# Patient Record
Sex: Male | Born: 1970 | Race: Black or African American | Hispanic: No | Marital: Single | Smoking: Current every day smoker
Health system: Southern US, Community
[De-identification: ages and names within clinical notes are randomized; demographics above are authoritative.]

---

## 2008-10-12 ENCOUNTER — Emergency Department (HOSPITAL_BASED_OUTPATIENT_CLINIC_OR_DEPARTMENT_OTHER): Admission: EM | Admit: 2008-10-12 | Discharge: 2008-10-12 | Payer: Self-pay | Admitting: Emergency Medicine

## 2008-10-12 ENCOUNTER — Ambulatory Visit: Payer: Self-pay | Admitting: Diagnostic Radiology

## 2009-01-04 ENCOUNTER — Ambulatory Visit: Payer: Self-pay | Admitting: Diagnostic Radiology

## 2009-01-04 ENCOUNTER — Emergency Department (HOSPITAL_BASED_OUTPATIENT_CLINIC_OR_DEPARTMENT_OTHER): Admission: EM | Admit: 2009-01-04 | Discharge: 2009-01-04 | Payer: Self-pay | Admitting: Emergency Medicine

## 2010-07-25 LAB — CBC
HCT: 47.7 % (ref 39.0–52.0)
Hemoglobin: 15.9 g/dL (ref 13.0–17.0)
MCHC: 33.3 g/dL (ref 30.0–36.0)
MCV: 84.4 fL (ref 78.0–100.0)
RDW: 13.2 % (ref 11.5–15.5)

## 2010-07-25 LAB — DIFFERENTIAL
Basophils Absolute: 0.4 10*3/uL — ABNORMAL HIGH (ref 0.0–0.1)
Basophils Relative: 3 % — ABNORMAL HIGH (ref 0–1)
Eosinophils Absolute: 0.4 10*3/uL (ref 0.0–0.7)
Eosinophils Relative: 3 % (ref 0–5)
Monocytes Absolute: 0.9 10*3/uL (ref 0.1–1.0)
Neutro Abs: 8 10*3/uL — ABNORMAL HIGH (ref 1.7–7.7)

## 2011-04-04 IMAGING — CR DG SHOULDER 2+V*L*
3 series · 3 of 3 positions shown · non-contrast
Comparison: 10/12/2008

CLINICAL DATA: Left shoulder pain

LEFT SHOULDER - 2+ VIEW

[w shoulder ap internal left]
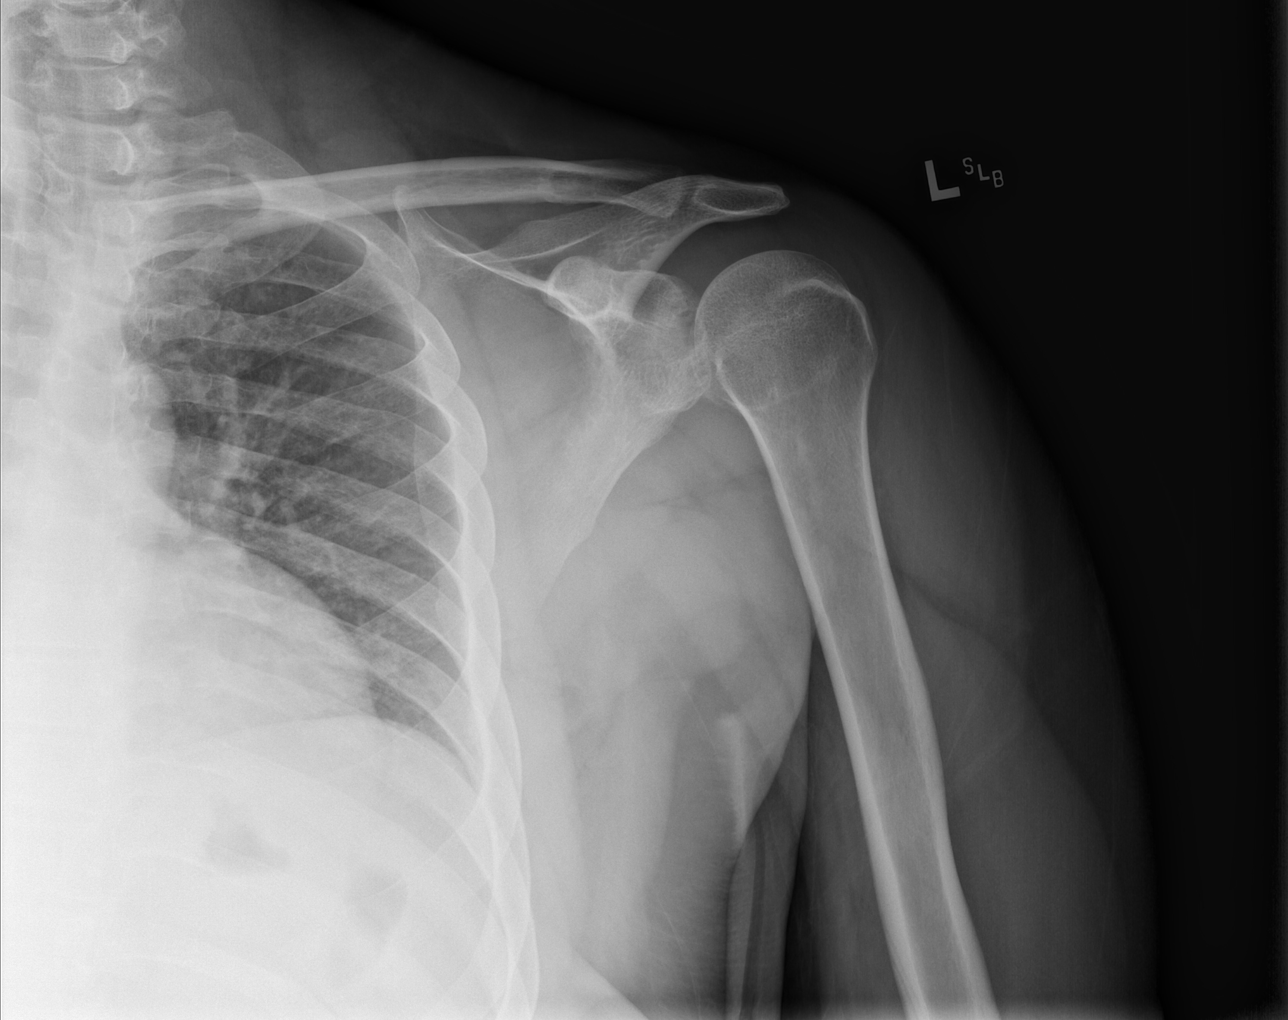

[w shoulder ap external left]
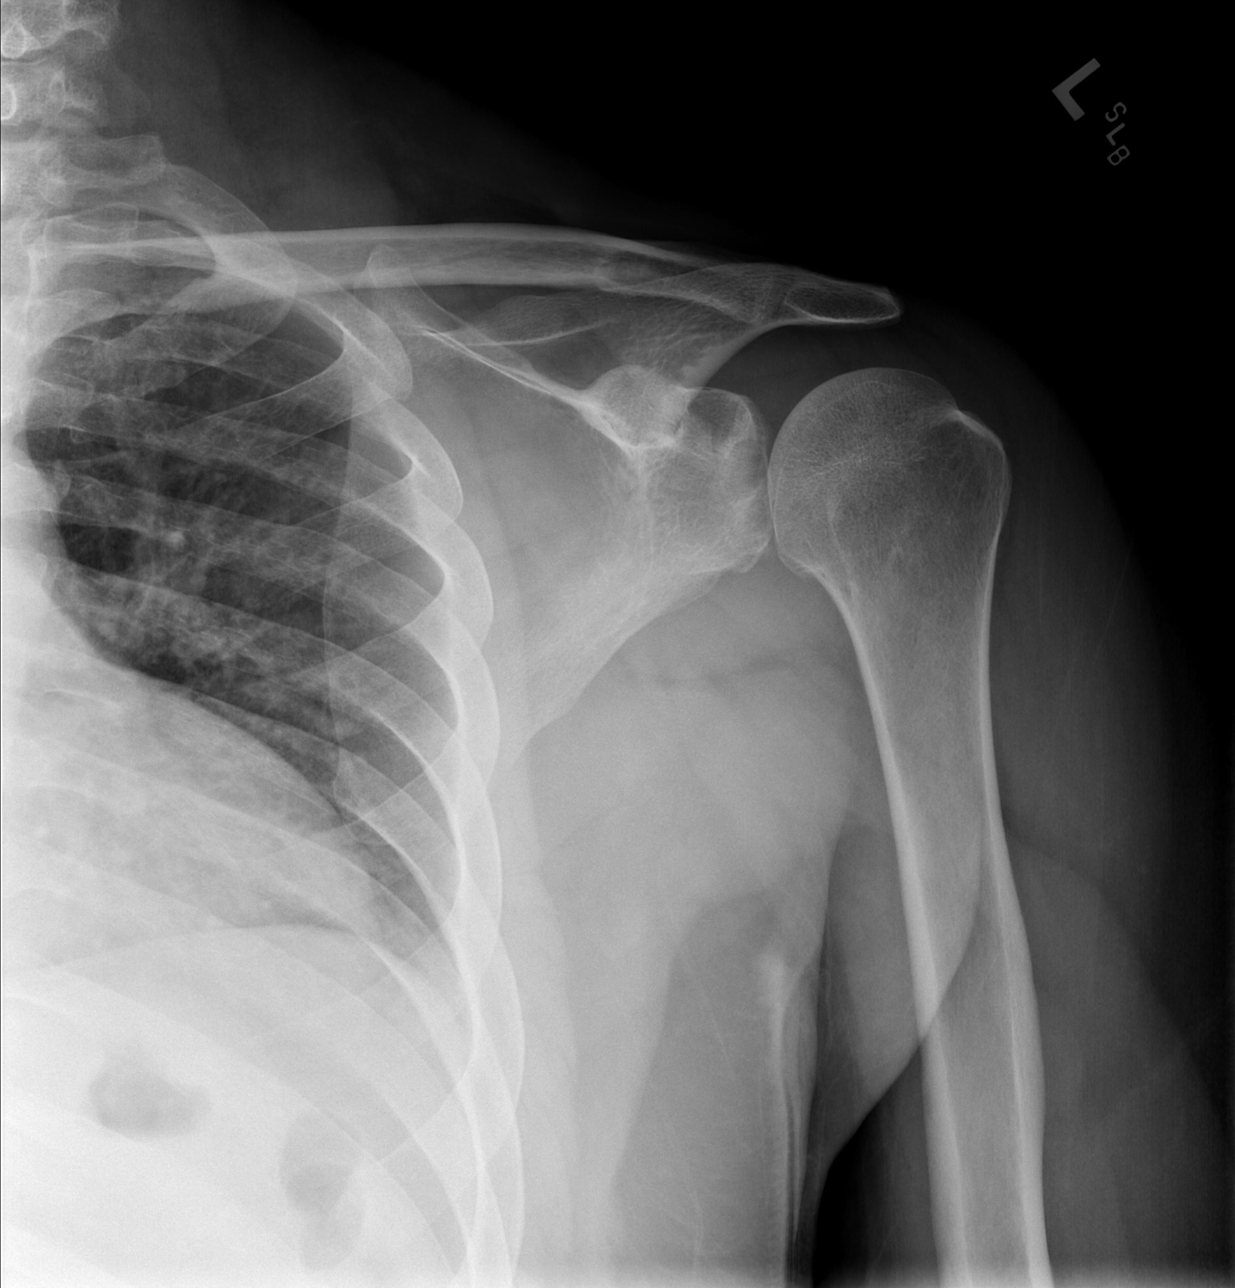

[w shoulder y view left]
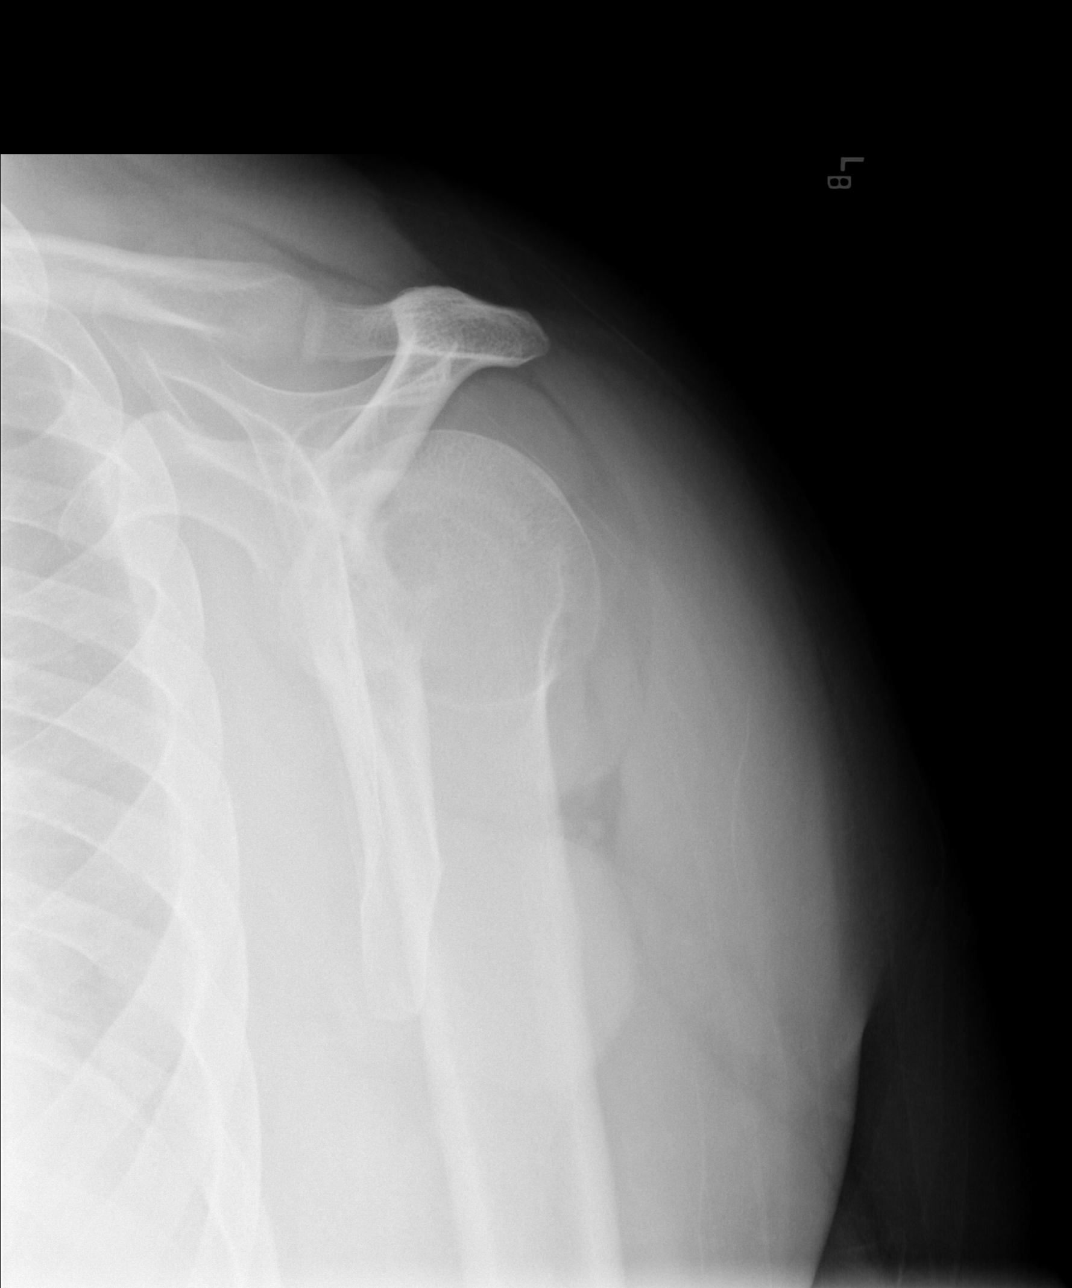

[3 of 3 positions shown; findings below may reference images not displayed]

FINDINGS: Three views of the left shoulder submitted.  No acute
fracture or subluxation.  Stable mild degenerative changes
IMPRESSION: No acute fracture or subluxation.  Stable mild degenerative
changes.

## 2018-12-09 ENCOUNTER — Encounter (HOSPITAL_BASED_OUTPATIENT_CLINIC_OR_DEPARTMENT_OTHER): Payer: Self-pay | Admitting: *Deleted

## 2018-12-09 ENCOUNTER — Other Ambulatory Visit: Payer: Self-pay

## 2018-12-09 ENCOUNTER — Emergency Department (HOSPITAL_BASED_OUTPATIENT_CLINIC_OR_DEPARTMENT_OTHER)
Admission: EM | Admit: 2018-12-09 | Discharge: 2018-12-09 | Disposition: A | Discharge status: Home / Self Care | Payer: Self-pay | Attending: Emergency Medicine | Admitting: Emergency Medicine

## 2018-12-09 DIAGNOSIS — K0889 Other specified disorders of teeth and supporting structures: Secondary | ICD-10-CM | POA: Insufficient documentation

## 2018-12-09 DIAGNOSIS — F17200 Nicotine dependence, unspecified, uncomplicated: Secondary | ICD-10-CM | POA: Insufficient documentation

## 2018-12-09 MED ORDER — AMOXICILLIN-POT CLAVULANATE 875-125 MG PO TABS: 1.0000 | ORAL_TABLET | Freq: Two times a day (BID) | ORAL | 0 refills | Status: AC

## 2018-12-09 MED ORDER — HYDROCODONE-ACETAMINOPHEN 5-325 MG PO TABS
2.0000 | ORAL_TABLET | Freq: Once | ORAL | Status: AC
  Administered 2018-12-09: 2 via ORAL
  Filled 2018-12-09: qty 2

## 2018-12-09 MED ORDER — HYDROCODONE-ACETAMINOPHEN 5-325 MG PO TABS: 1.0000 | ORAL_TABLET | ORAL | 0 refills | Status: AC | PRN

## 2018-12-09 NOTE — ED Notes (Signed)
Patient is resting comfortably. 

## 2018-12-09 NOTE — ED Triage Notes (Signed)
Dental pain on and off x 2 days.

## 2018-12-09 NOTE — ED Provider Notes (Signed)
[facility] EMERGENCY DEPARTMENT Provider Note   CSN: [id number] Arrival date & time: 12/09/18  1401     History   Chief Complaint Chief Complaint  Patient presents with  . Dental Pain    HPI Brett Keith is a 48 y.o. male.     HPI 48 year old male presents with severe right dental pain.  He states both maxillary and mandibular pain.  Started 2 days ago.  He has had pain in this area before ever since he had 2 teeth removed 2 or 3 years ago.  This is the worst is ever been.  No injuries.  No swelling or fever.  Tried some of his wife's Percocet with no relief.  History reviewed. No pertinent past medical history.  There are no active problems to display for this patient.   History reviewed. No pertinent surgical history.      Home Medications    Prior to Admission medications   Medication Sig Start Date End Date Taking? Authorizing Provider  amoxicillin-clavulanate (AUGMENTIN) 875-125 MG tablet Take 1 tablet by mouth 2 (two) times daily. One po bid x 7 days 12/09/18   Sherwood Gambler, MD  HYDROcodone-acetaminophen [facility]) 5-325 MG tablet Take 1 tablet by mouth every 4 (four) hours as needed for severe pain. 12/09/18   Sherwood Gambler, MD    Family History No family history on file.  Social History Social History   Tobacco Use  . Smoking status: Current Every Day Smoker  . Smokeless tobacco: Never Used  Substance Use Topics  . Alcohol use: Not on file  . Drug use: Not on file     Allergies   Patient has no known allergies.   Review of Systems Review of Systems  Constitutional: Negative for fever.  HENT: Positive for dental problem. Negative for facial swelling.   Respiratory: Negative for shortness of breath.      Physical Exam Updated Vital Signs BP (!) 142/110 (BP Location: Left Arm)   Pulse 61   Temp 99.1 F (37.3 C) (Oral)   Resp 16   Ht 5\' 11"  (1.803 m)   Wt 131.5 kg   SpO2 100%   BMI 40.45 kg/m   Physical Exam Vitals  signs and nursing note reviewed.  Constitutional:      Appearance: He is well-developed.  HENT:     Head: Normocephalic and atraumatic.     Comments: Diffuse pain to the right mandibular and maxillary gum lines. No overt abscess. No facial swelling    Right Ear: External ear normal.     Left Ear: External ear normal.     Nose: Nose normal.  Eyes:     General:        Right eye: No discharge.        Left eye: No discharge.  Neck:     Musculoskeletal: Neck supple.  Cardiovascular:     Rate and Rhythm: Normal rate and regular rhythm.     Heart sounds: Normal heart sounds.  Pulmonary:     Effort: Pulmonary effort is normal.     Breath sounds: Normal breath sounds.  Abdominal:     General: There is no distension.  Skin:    General: Skin is warm and dry.  Neurological:     Mental Status: He is alert.  Psychiatric:        Mood and Affect: Mood is not anxious.      ED Treatments / Results  Labs (all labs ordered are listed, but only abnormal  results are displayed) Labs Reviewed - No data to display  EKG None  Radiology No results found.  Procedures Procedures (including critical care time)  Medications Ordered in ED Medications  HYDROcodone-acetaminophen (NORCO/VICODIN) 5-325 MG per tablet 2 tablet (2 tablets Oral Given 12/09/18 1432)     Initial Impression / Assessment and Plan / ED Course  I have reviewed the triage vital signs and the nursing notes.  Pertinent labs & imaging results that were available during my care of the patient were reviewed by me and considered in my medical decision making (see chart for details).        Patient appears in significant pain though I cannot find an obvious drainable abscess.  Likely the causes dental, perhaps pulpitis.  He has naproxen at home and I will give him a short course of Norco for pain as well as covering with Augmentin.  Refer to dentistry/oral surgery. Discussed return precautions.  Final Clinical  Impressions(s) / ED Diagnoses   Final diagnoses:  Pain, dental    ED Discharge Orders         Ordered    HYDROcodone-acetaminophen (NORCO) 5-325 MG tablet  Every 4 hours PRN     12/09/18 1437    amoxicillin-clavulanate (AUGMENTIN) 875-125 MG tablet  2 times daily     12/09/18 1437           Pricilla LovelessGoldston, Bre Pecina, MD 12/09/18 1445

## 2018-12-09 NOTE — Discharge Instructions (Signed)
If you develop fever, trouble breathing or swallowing, vomiting, or uncontrolled pain, or any other new/concerning symptoms then return to the ER for evaluation.

## 2018-12-09 NOTE — ED Notes (Signed)
Pt teaching provided on medications that may cause drowsiness. Pt instructed not to drive or operate heavy machinery while taking the prescribed medication. Pt verbalized understanding.   

## 2020-11-17 ENCOUNTER — Encounter (HOSPITAL_BASED_OUTPATIENT_CLINIC_OR_DEPARTMENT_OTHER): Payer: Self-pay

## 2020-11-17 ENCOUNTER — Other Ambulatory Visit: Payer: Self-pay

## 2020-11-17 ENCOUNTER — Emergency Department (HOSPITAL_BASED_OUTPATIENT_CLINIC_OR_DEPARTMENT_OTHER)
Admission: EM | Admit: 2020-11-17 | Discharge: 2020-11-17 | Disposition: A | Discharge status: Home / Self Care | Payer: Self-pay | Attending: Emergency Medicine | Admitting: Emergency Medicine

## 2020-11-17 DIAGNOSIS — F17210 Nicotine dependence, cigarettes, uncomplicated: Secondary | ICD-10-CM | POA: Insufficient documentation

## 2020-11-17 DIAGNOSIS — L299 Pruritus, unspecified: Secondary | ICD-10-CM | POA: Insufficient documentation

## 2020-11-17 DIAGNOSIS — R21 Rash and other nonspecific skin eruption: Secondary | ICD-10-CM

## 2020-11-17 MED ORDER — PREDNISONE 50 MG PO TABS: 50.0000 mg | ORAL_TABLET | Freq: Every day | ORAL | 0 refills | Status: AC

## 2020-11-17 MED ORDER — HYDROXYZINE HCL 25 MG PO TABS: 25.0000 mg | ORAL_TABLET | Freq: Four times a day (QID) | ORAL | 0 refills | Status: AC

## 2020-11-17 MED ORDER — HYDROCORTISONE 2.5 % EX LOTN: TOPICAL_LOTION | Freq: Two times a day (BID) | CUTANEOUS | 0 refills | Status: AC

## 2020-11-17 NOTE — ED Provider Notes (Signed)
[facility] EMERGENCY DEPARTMENT Provider Note   CSN: [id number] Arrival date & time: 11/17/20  1537     History Chief Complaint  Patient presents with   Poison Ivy    Brett Keith is a 50 y.o. male with no significant past medical history who presents for evaluation of rash.  Rash to entire body x1 week.  States this began after washing his dog who is out in the yard.  No rash to palms, soles, mucous membranes.  Rash is pruritic in nature.  Nontender.  No vesicles or bulla.  Did state he did yard work last week.  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  No new lotions, perfumes or detergents.  No lesions to mouth, sensation of throat closing, cough, shortness of breath abdominal pain or emesis.  Has never had anything like this previously.  Is been using calamine lotion without relief.  Denies additional aggravating or alleviating factors. No myalgias, tick bites.  History obtained from patient and past medical records.  No interpreter used.  HPI     History reviewed. No pertinent past medical history.  There are no problems to display for this patient.   History reviewed. No pertinent surgical history.     No family history on file.  Social History   Tobacco Use   Smoking status: Every Day    Types: Cigarettes   Smokeless tobacco: Never  Vaping Use   Vaping Use: Never used  Substance Use Topics   Alcohol use: Yes    Comment: occ   Drug use: Not Currently    Home Medications Prior to Admission medications   Medication Sig Start Date End Date Taking? Authorizing Provider  hydrocortisone 2.5 % lotion Apply topically 2 (two) times daily. Do not apply to face 11/17/20  Yes Hannahgrace Lalli A, PA-C  hydrOXYzine (ATARAX/VISTARIL) 25 MG tablet Take 1 tablet (25 mg total) by mouth every 6 (six) hours. 11/17/20  Yes Leafy Motsinger A, PA-C  predniSONE (DELTASONE) 50 MG tablet Take 1 tablet (50 mg total) by mouth daily for 5 days. 11/17/20  11/22/20 Yes Adrianah Prophete A, PA-C  amoxicillin-clavulanate (AUGMENTIN) 875-125 MG tablet Take 1 tablet by mouth 2 (two) times daily. One po bid x 7 days 12/09/18   Pricilla Loveless, MD  HYDROcodone-acetaminophen [facility]) 5-325 MG tablet Take 1 tablet by mouth every 4 (four) hours as needed for severe pain. 12/09/18   Pricilla Loveless, MD    Allergies    Patient has no known allergies.  Review of Systems   Review of Systems  Constitutional: Negative.   HENT: Negative.    Respiratory: Negative.    Cardiovascular: Negative.   Gastrointestinal: Negative.   Genitourinary: Negative.   Musculoskeletal: Negative.   Skin:  Positive for rash.  Neurological: Negative.   All other systems reviewed and are negative.  Physical Exam Updated Vital Signs BP 123/79 (BP Location: Right Arm)   Pulse 99   Temp 98.9 F (37.2 C) (Oral)   Resp 16   Ht 5\' 11"  (1.803 m)   Wt 136.1 kg   SpO2 96%   BMI 41.84 kg/m   Physical Exam Vitals and nursing note reviewed.  Constitutional:      General: He is not in acute distress.    Appearance: He is well-developed. He is not ill-appearing, toxic-appearing or diaphoretic.  HENT:     Head: Normocephalic and atraumatic.     Nose: Nose normal.     Mouth/Throat:  Mouth: Mucous membranes are moist.     Comments: No intraoral lesions.  No angioedema Eyes:     Pupils: Pupils are equal, round, and reactive to light.  Cardiovascular:     Rate and Rhythm: Normal rate and regular rhythm.     Pulses: Normal pulses.     Heart sounds: Normal heart sounds.  Pulmonary:     Effort: Pulmonary effort is normal. No respiratory distress.     Breath sounds: Normal breath sounds.  Abdominal:     General: Bowel sounds are normal. There is no distension.     Palpations: Abdomen is soft.  Musculoskeletal:        General: Normal range of motion.     Cervical back: Normal range of motion and neck supple.     Comments: No bony tenderness.  Moves all 4 extremities without  difficulty  Skin:    General: Skin is warm and dry.     Capillary Refill: Capillary refill takes less than 2 seconds.     Findings: Rash present.     Comments: Erythematous rash to anterior, posterior chest as well as bilateral upper extremities.  No rash to mucous membranes.  No vesicles, bulla, target lesions, desquamated skin.  No pustules.  No lesions concerning for monkey box.  Neurological:     General: No focal deficit present.     Mental Status: He is alert and oriented to person, place, and time.     ED Results / Procedures / Treatments   Labs (all labs ordered are listed, but only abnormal results are displayed) Labs Reviewed - No data to display  EKG None  Radiology No results found.  Procedures Procedures   Medications Ordered in ED Medications - No data to display  ED Course  I have reviewed the triage vital signs and the nursing notes.  Pertinent labs & imaging results that were available during my care of the patient were reviewed by me and considered in my medical decision making (see chart for details).  Here for evaluation of rash x1 week.  He is afebrile, nonseptic, not ill-appearing.  No systemic symptoms.  Began after washing dog with been on the yard.  Rash is pruritic in nature.  No rash to mucous membranes.  No rash to palms or soles.  Nonconcerning for monkey box.  No sensation of throat closing, new lotions, perfumes or concerning signs for allergic reaction.  No viral symptoms.  Patient denies any difficulty breathing or swallowing.  Pt has a patent airway without stridor and is handling secretions without difficulty; no angioedema. No blisters, no pustules, no warmth, no draining sinus tracts, no superficial abscesses, no bullous impetigo, no vesicles, no desquamation, no target lesions with dusky purpura or a central bulla. Not tender to touch. No concern for superimposed infection. No concern for SJS, TEN, TSS, tick borne illness, syphilis or other  life-threatening condition. Will discharge home with short course of steroids, pepcid and recommend Benadryl as needed for pruritis.     MDM Rules/Calculators/A&P                            Final Clinical Impression(s) / ED Diagnoses Final diagnoses:  Rash    Rx / DC Orders ED Discharge Orders          Ordered    predniSONE (DELTASONE) 50 MG tablet  Daily        11/17/20 1951    hydrocortisone 2.5 %  lotion  2 times daily        11/17/20 1951    hydrOXYzine (ATARAX/VISTARIL) 25 MG tablet  Every 6 hours        11/17/20 1951             Troyce Gieske A, PA-C 11/17/20 1952    Milagros Loll, MD 11/20/20 2157

## 2020-11-17 NOTE — ED Triage Notes (Signed)
Pt c/o poison ivy/scattered rash x 1 week-NAD-steady gait

## 2020-11-17 NOTE — Discharge Instructions (Addendum)
May use hydrocortisone cream on rash.  Do not apply to face Take prednisone as prescribed May take Atarax as needed for itching  Follow-up with primary care provider or dermatology if her symptoms do not improve.  Return for new or worsening symptoms

## 2021-10-03 NOTE — ED Provider Notes (Signed)
 Emergency Department Provider Note  Dragon voice dictation used for charting.    Provider at bedside: 7:11 AM  History obtained from the: Patient  History   Chief Complaint  Patient presents with   Hand Pain     HPI  Brett Keith is a 51 y.o. male who presents to the ED with complaints of left hand pain.  The patient states that he was involved in an accident at work approximately 1 year ago.  He has been working with [employer]. to get this injury evaluated and notes that he has been through physical therapy and has seen an orthopedic specialist, however he is currently waiting for referrals to other specialists at this time.  Today, he is complaining of pain to the dorsum of the left hand and increased swelling.  He denies any recent injuries to the area and he has not had any fevers.    No LMP for male patient.   Past Medical History History reviewed. No pertinent past medical history.  Past Surgical History History reviewed. No pertinent surgical history.   Medications Home Medications  Medication Sig Dispense Refill   azithromycin (ZITHROMAX) 250 MG tablet Take two tablets on the first day and then one tablet every day after. 6 tablet 0   benzonatate (TESSALON PERLES) 200 MG capsule Take 1 capsule (200 mg total) by mouth 3 (three)  times daily as needed for Cough. 20 capsule 0   diclofenac (VOLTAREN) 1 % Gel gel Apply 4g the the affected area up to four times daily, max dose 16g/day 200 g 0     Allergies No Known Allergies   Family History History reviewed. No pertinent family history.   Social History Social History   Tobacco Use   Smoking status: Every Day    Packs/day: 0.50    Types: Cigarettes  Substance Use Topics   Alcohol use: No   Drug use: No     Review of Systems  Review of Systems  Constitutional: Negative for chills and fever.  Musculoskeletal:       L hand pain and swelling  Allergic/Immunologic: Negative for  immunocompromised state.  Hematological: Does not bruise/bleed easily.     Physical Exam   Vitals:   10/03/21 0630 10/03/21 0700 10/03/21 0739 10/03/21 0742  BP: 137/95 141/89 124/99 129/99  Pulse: 84 78 78   Temp: 98.5 F (36.9 C)     Resp: 16 18  20   SpO2: 98% 97% 97% 96%  PainSc: 8-Eight (severe)       Physical Exam Vitals and nursing note reviewed.  Constitutional:      Appearance: Normal appearance.  HENT:     Head: Normocephalic and atraumatic.     Nose: Nose normal.  Eyes:     Conjunctiva/sclera: Conjunctivae normal.  Cardiovascular:     Rate and Rhythm: Normal rate and regular rhythm.     Pulses: Normal pulses.     Heart sounds: Normal heart sounds.  Pulmonary:     Effort: Pulmonary effort is normal.     Breath sounds: Normal breath sounds.  Musculoskeletal:     Comments: Mild swelling to the dorsum of the right hand near the third/fourth MCP.  Patient is tender to palpation in this area.  He is not able to make a fist and refuses to perform range of motion of the index finger.  He is able to perform passive range of motion, but notes limited range of motion due to pain.  In the  dorsum of the hand, no excessive warmth to the touch, erythema, induration, or fluctuance.  Good cap refill, no sensation deficits  Skin:    General: Skin is warm and dry.     Capillary Refill: Capillary refill takes 2 to 3 seconds.  Neurological:     Mental Status: He is alert.  Psychiatric:        Mood and Affect: Mood normal.        Behavior: Behavior normal.     Labs   No results found for this or any previous visit (from the past 72 hour(s)).   Radiology   No results found for any visits on 10/03/21.  EKG     ED Course      Procedure Note   Procedures  Medical Decision Making   Clinical Complexity  Patient's presentation is most consistent with exacerbation of chronic illness..     Provider time spent in patient care today, inclusive of but not limited  to clinical reassessment, review of diagnostic studies, and discharge preparation, was less than 30 minutes.    Medical Decision Making Chronic hand pain, left: chronic illness or injury Amount and/or Complexity of Data Reviewed External Data Reviewed: notes.    Details: Orthopedics visit on 09/14/2021 for hand pain   Risk OTC drugs. Prescription drug management.      DDx: Strain, sprain, fracture, ganglion cyst, septic arthritis, gout  ED Clinical Impression   1. Chronic hand pain, left      ED Assessment/Plan  Mr. Corona presented to the ED with a chief complaint of hand pain.  Upon arrival to patient, he was resting on the hospital bed in no acute distress.  Physical exam was remarkable for the above findings. Chart review shows that the patient has had an MRI of this area and has seen hand specialist Dr. Anitra.  Given that he did not have any recent injuries to the hand, I do not feel an x-ray is needed at this time as I have a low suspicion for fracture.  Physical exam today is not consistent with infection/septic arthritis.  MRI did note a ganglion cyst, it is possible that this area has become inflamed and is causing his symptoms today.  The patient notes that he has tried multiple anti-inflammatories and steroids.  He has not tried any topical diclofenac gel, so we will try this today.  I also encouraged him to use Tylenol  as well as ice and elevation of the area.  Mr. Turnley and his wife seem frustrated today due to having to wait for referrals from [employer]. to various specialists.  I encouraged them to follow-up with their Worker[employer]. contacts to try to expedite the process.  Note that they have to be referred to the specialist, so I did not provide the contact information of other specialist at this time.  Strict return precautions were discussed and the patient was given the rest of his discharge instructions.  Stated that he understood and had no further questions.   He was discharged in stable condition.  ED Meds Given During Visit Medications - No data to display    Discharge Medication List as of 10/03/2021  7:38 AM    START taking these medications   Details  diclofenac (VOLTAREN) 1 % Gel gel Apply 4g the the affected area up to four times daily, max dose 16g/day, Normal          FOLLOW UP Moogali Sharyle Honer, MD [phone] [place] [address] [phone]   As needed  Emergency - [facility], [facility] [address] [place]  [address] [phone]  As needed   Electronically signed by: 7:11 AM 10/03/2021 for [facility] Schlagheck PA-C    Electronically signed by: Peyton PARAS Schlagheck, PA-C 10/03/21 [phone]

## 2024-05-15 ENCOUNTER — Other Ambulatory Visit (HOSPITAL_BASED_OUTPATIENT_CLINIC_OR_DEPARTMENT_OTHER): Payer: Self-pay

## 2024-05-15 ENCOUNTER — Encounter (HOSPITAL_BASED_OUTPATIENT_CLINIC_OR_DEPARTMENT_OTHER): Payer: Self-pay

## 2024-05-15 ENCOUNTER — Emergency Department (HOSPITAL_BASED_OUTPATIENT_CLINIC_OR_DEPARTMENT_OTHER)
Admission: EM | Admit: 2024-05-15 | Discharge: 2024-05-15 | Disposition: A | Discharge status: Home / Self Care | Attending: Emergency Medicine | Admitting: Emergency Medicine

## 2024-05-15 ENCOUNTER — Emergency Department (HOSPITAL_BASED_OUTPATIENT_CLINIC_OR_DEPARTMENT_OTHER)

## 2024-05-15 ENCOUNTER — Other Ambulatory Visit: Payer: Self-pay

## 2024-05-15 DIAGNOSIS — M25562 Pain in left knee: Secondary | ICD-10-CM | POA: Diagnosis present

## 2024-05-15 MED ORDER — METHYLPREDNISOLONE 4 MG PO TBPK
ORAL_TABLET | ORAL | 0 refills | Status: AC
  Filled 2024-05-15: qty 21, 6d supply, fill #0

## 2024-05-15 MED ORDER — OXYCODONE HCL 5 MG PO TABS
5.0000 mg | ORAL_TABLET | Freq: Once | ORAL | Status: AC
  Administered 2024-05-15: 5 mg via ORAL
  Filled 2024-05-15: qty 1

## 2024-05-15 MED ORDER — PREDNISONE 50 MG PO TABS
60.0000 mg | ORAL_TABLET | Freq: Once | ORAL | Status: AC
  Administered 2024-05-15: 60 mg via ORAL
  Filled 2024-05-15: qty 1

## 2024-05-15 NOTE — ED Triage Notes (Signed)
 Pt reporting bilateral leg pain and mild swelling yesterday, reporting significantly worse pain this AM in left leg only. Ambulatory but altered gait to room. Denies known injury or precipitating factors. Pain from knee upward on LLE. Limited ROM

## 2024-05-15 NOTE — Discharge Instructions (Addendum)
 Use Ace wrap for comfort.  Take next dose of steroids tomorrow and follow the package insert.  Recommend ice 20 minutes on several times a day.  Minimal weightbearing with crutches or cane.  Follow-up with orthopedics.  I do think he likely has inflammatory process in the left knee.  My hope is that medications will help.  He can continue 1000 mg of Tylenol  every 6 hours as needed for pain as well.  Return if symptoms worsen.  If you develop any redness or skin infection changes please return for evaluation.  Or any other worsening symptoms.

## 2024-05-15 NOTE — ED Provider Notes (Addendum)
 " Brett Keith Provider Note   CSN: [id number] Arrival date & time: 05/15/24  1245     Patient presents with: Knee Pain   Brett Keith is Keith 54 y.o. male.   Patient here with swelling to his left knee.  Denies any specific, or falls.  He has been pretty active this week having [employer] Supples now.  He denies any swelling in the knee in the past.  Does not have any pain in his calf or posterior part of the leg.  No blood clot history.  Denies any fever or chills.  Denies any major redness.  The history is provided by the patient.       Prior to Admission medications  Medication Sig Start Date End Date Taking? Authorizing Provider  methylPREDNISolone  (MEDROL  DOSEPAK) 4 MG TBPK tablet Follow package insert 05/15/24  Yes Brett Mcferran, DO  amoxicillin -clavulanate (AUGMENTIN ) 875-125 MG tablet Take 1 tablet by mouth 2 (two) times daily. One po bid x 7 days 12/09/18   Brett Hamilton, MD  HYDROcodone -acetaminophen  (NORCO) 5-325 MG tablet Take 1 tablet by mouth every 4 (four) hours as needed for severe pain. 12/09/18   Brett Hamilton, MD  hydrocortisone  2.5 % lotion Apply topically 2 (two) times daily. Do not apply to face 11/17/20   Henderly, Britni A, PA-C  hydrOXYzine  (ATARAX /VISTARIL ) 25 MG tablet Take 1 tablet (25 mg total) by mouth every 6 (six) hours. 11/17/20   Henderly, Britni A, PA-C    Allergies: Patient has no known allergies.    Review of Systems  Updated Vital Signs BP 131/81 (BP Location: Right Arm)   Pulse 79   Temp 98.9 F (37.2 C) (Oral)   Resp 18   Ht 5' 11 (1.803 m)   Wt (!) 138.3 kg   SpO2 98%   BMI 42.54 kg/m   Physical Exam Vitals and nursing note reviewed.  Constitutional:      General: He is not in acute distress.    Appearance: He is well-developed.  HENT:     Head: Normocephalic and atraumatic.     Mouth/Throat:     Mouth: Mucous membranes are moist.  Eyes:     Extraocular Movements: Extraocular movements  intact.     Conjunctiva/sclera: Conjunctivae normal.     Pupils: Pupils are equal, round, and reactive to light.  Cardiovascular:     Rate and Rhythm: Normal rate and regular rhythm.     Pulses: Normal pulses.     Heart sounds: No murmur heard. Pulmonary:     Effort: Pulmonary effort is normal. No respiratory distress.     Breath sounds: Normal breath sounds.  Abdominal:     Palpations: Abdomen is soft.     Tenderness: There is no abdominal tenderness.  Musculoskeletal:        General: Swelling and tenderness present. Normal range of motion.     Cervical back: Neck supple.     Comments: Patient has swelling to the suprapatella area of the left knee but he can flex and extend without any major difficulty or restriction there is no redness or warmth  Skin:    General: Skin is warm and dry.     Capillary Refill: Capillary refill takes less than 2 seconds.  Neurological:     General: No focal deficit present.     Mental Status: He is alert.     Sensory: No sensory deficit.     Motor: No weakness.  Psychiatric:  Mood and Affect: Mood normal.     (all labs ordered are listed, but only abnormal results are displayed) Labs Reviewed - No data to display  EKG: None  Radiology: DG Knee Complete 4 Views Left Result Date: 05/15/2024 CLINICAL DATA:  Left knee pain. EXAM: LEFT KNEE - COMPLETE 4+ VIEW COMPARISON:  None Available. FINDINGS: No evidence of fracture, dislocation, or sizable joint effusion. Mild medial compartment joint space narrowing. Soft tissues are unremarkable. IMPRESSION: 1. No acute osseous abnormality. 2. Mild medial compartment joint space narrowing. Electronically Signed   By: Harrietta Sherry M.D.   On: 05/15/2024 14:40     Procedures   Medications Ordered in the ED  predniSONE  (DELTASONE ) tablet 60 mg (60 mg Oral Given 05/15/24 1318)  oxyCODONE  (Oxy IR/ROXICODONE ) immediate release tablet 5 mg (5 mg Oral Given 05/15/24 1319)                                     Medical Decision Making Amount and/or Complexity of Data Reviewed Radiology: ordered.  Risk Prescription drug management.   Brett Keith is here with left knee pain.  No major medical problems.  Patient with swelling to the suprapatella bursa of the left knee suspicious for likely inflammatory process.  I have very low suspicion for fracture or malalignment but will get x-ray.  He denies any trauma.  I do suspect that this is likely from repetitive action use.  I have no concern for septic joint.  He has no fever.  He is got good range of motion and clinically I do not think that this is infection.  He is neurovascular neuromuscular intact on exam.  There is no concern for arterial process or DVT per my history and physical.  No tenderness to the calf or the posterior portion of the leg and there is no major leg swelling doubt DVT.  There is no cellulitic changes.  Will get x-ray will give dose of prednisone  and Roxicodone  for pain management in the ED.  Per my review interpretation of x-ray I do not see any obvious fracture or malalignment.  Clinically effusion in the suprapatella space is present.  Will provide Ace wrap Medrol  Dosepak ice rest Tylenol  and follow-up with orthopedics for further management.  Have no concern for infectious process.  Discharge.  This chart was dictated using voice recognition software.  Despite best efforts to proofread,  errors can occur which can change the documentation meaning.      Final diagnoses:  Acute pain of left knee    ED Discharge Orders          Ordered    methylPREDNISolone  (MEDROL  DOSEPAK) 4 MG TBPK tablet        05/15/24 1415               CuratoloJuliene, DO 05/15/24 1432    Ruthe Juliene, DO 05/15/24 1444  "

## 2024-05-15 NOTE — ED Notes (Signed)
